# Patient Record
Sex: Female | Born: 1990 | Race: White | Hispanic: Yes | Marital: Single | Smoking: Never smoker
Health system: Southern US, Community
[De-identification: ages and names within clinical notes are randomized; demographics above are authoritative.]

---
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
## 2012-08-05 IMAGING — CR DG CHEST 2V
2 series · 2 of 2 positions shown · non-contrast
Comparison: 02/16/2011

CLINICAL DATA: Left chest pain

CHEST - 2 VIEW

[w chest pa]
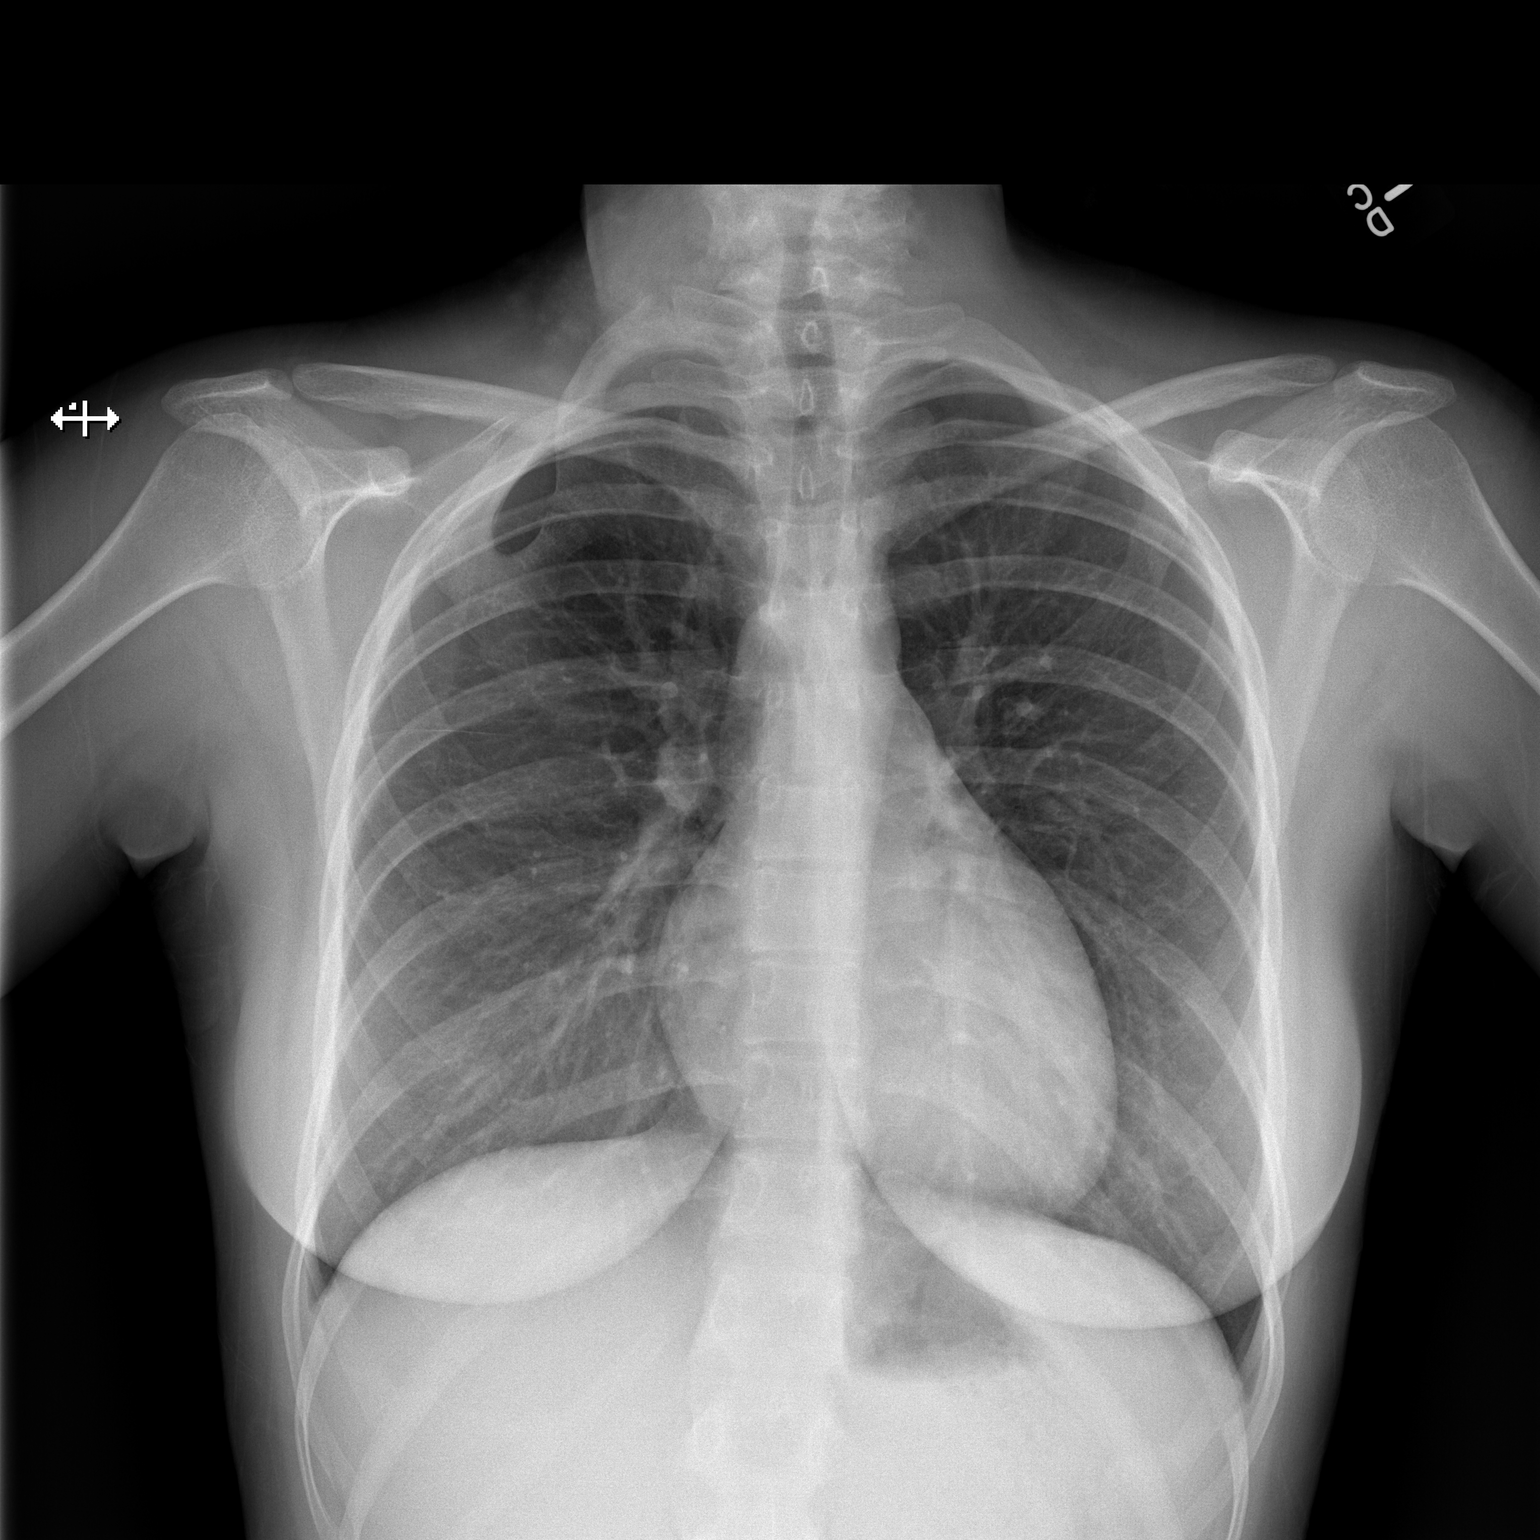

[w chest lat]
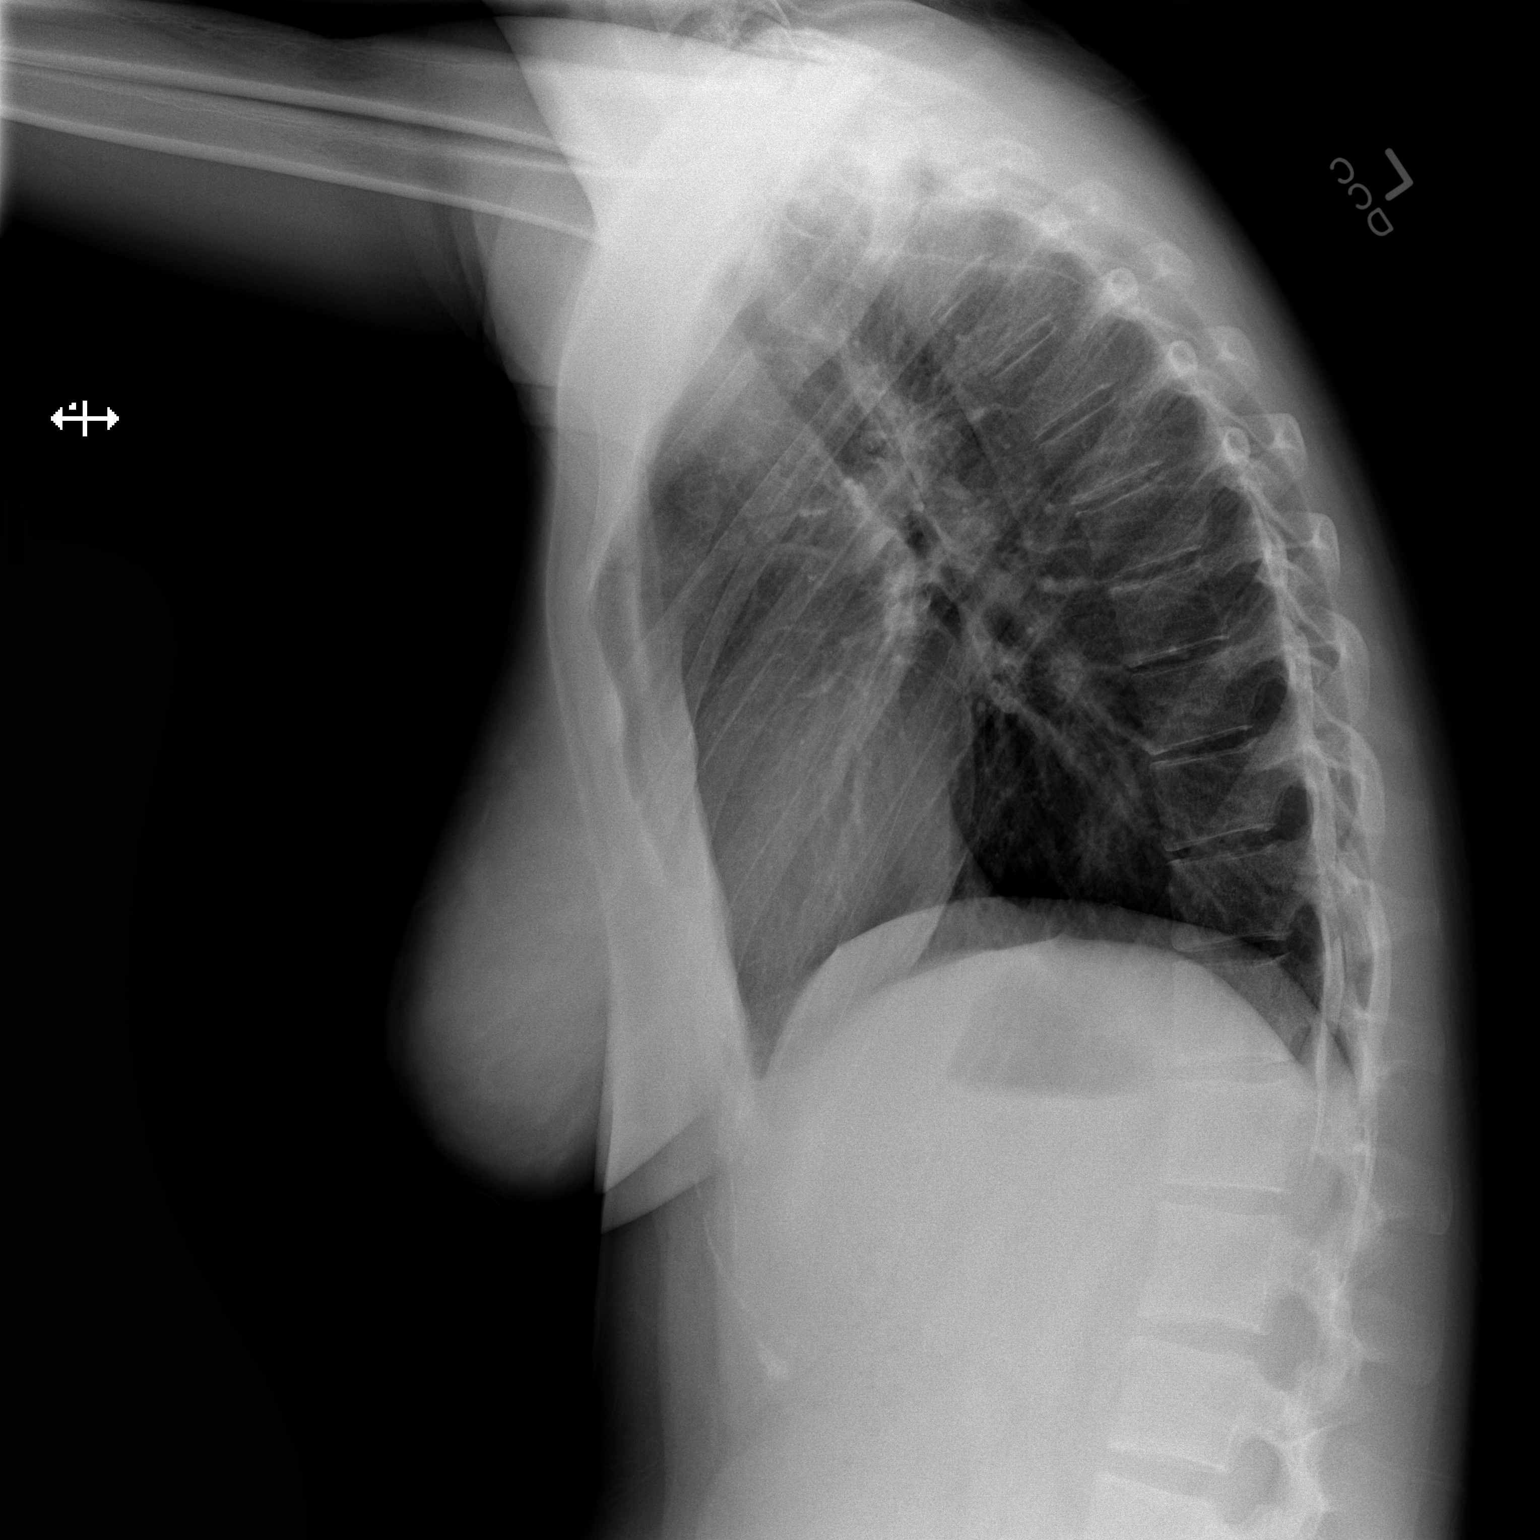

[2 of 2 positions shown; findings below may reference images not displayed]

FINDINGS: Lungs are clear. No pleural effusion or pneumothorax.

Cardiomediastinal silhouette is within normal limits.

Abnormal fusion of the right anterior first and second ribs.
Visualized osseous structures are otherwise within normal limits.
IMPRESSION: No evidence of acute cardiopulmonary disease.

## 2013-12-19 ENCOUNTER — Ambulatory Visit (INDEPENDENT_AMBULATORY_CARE_PROVIDER_SITE_OTHER): Payer: Managed Care, Other (non HMO) | Admitting: Physician Assistant

## 2013-12-19 VITALS — BP 110/68 | HR 112 | Temp 99.6°F | Resp 16 | Ht 63.75 in | Wt 150.0 lb

## 2013-12-19 DIAGNOSIS — H9209 Otalgia, unspecified ear: Secondary | ICD-10-CM

## 2013-12-19 DIAGNOSIS — H9201 Otalgia, right ear: Secondary | ICD-10-CM

## 2013-12-19 DIAGNOSIS — J039 Acute tonsillitis, unspecified: Secondary | ICD-10-CM

## 2013-12-19 DIAGNOSIS — J029 Acute pharyngitis, unspecified: Secondary | ICD-10-CM

## 2013-12-19 LAB — POCT CBC
Granulocyte percent: 83.4 %G — AB (ref 37–80)
HCT, POC: 40.3 % (ref 37.7–47.9)
Hemoglobin: 12.6 g/dL (ref 12.2–16.2)
Lymph, poc: 1.2 (ref 0.6–3.4)
MCH, POC: 26.7 pg — AB (ref 27–31.2)
MCHC: 31.4 g/dL — AB (ref 31.8–35.4)
MCV: 85.1 fL (ref 80–97)
MID (cbc): 0.4 (ref 0–0.9)
MPV: 7.4 fL (ref 0–99.8)
POC Granulocyte: 8 — AB (ref 2–6.9)
POC LYMPH PERCENT: 12.8 %L (ref 10–50)
POC MID %: 3.8 %M (ref 0–12)
Platelet Count, POC: 202 10*3/uL (ref 142–424)
RBC: 4.73 M/uL (ref 4.04–5.48)
RDW, POC: 13.9 %
WBC: 9.6 10*3/uL (ref 4.6–10.2)

## 2013-12-19 LAB — POCT RAPID STREP A (OFFICE): Rapid Strep A Screen: NEGATIVE

## 2013-12-19 MED ORDER — FIRST-DUKES MOUTHWASH MT SUSP: 5.0000 mL | OROMUCOSAL | Status: DC | PRN

## 2013-12-19 MED ORDER — AMOXICILLIN 875 MG PO TABS: 875.0000 mg | ORAL_TABLET | Freq: Two times a day (BID) | ORAL | Status: DC

## 2013-12-19 NOTE — Progress Notes (Signed)
Subjective:    Patient ID: Donna Key, female    DOB: 04-17-1991, 23 y.o.   MRN: [id number]  HPI 23 year old female presents for evaluation of 2 day history of body aches, chills, sore throat, and right sided ear pain. States symptoms continue to worsen and now she is having difficulty swallowing.  Admits pain on R>L.  Denies nasal congestion, cough, nausea, vomiting, or dizziness.  Took tylenol at 12:30 today which has not helped much. No trouble breathing.  Hx of strep 2 years ago. 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 Patient is otherwise healthy with no other concerns today.     Review of Systems  Constitutional: Positive for fever and chills.  HENT: Positive for ear pain (right), sore throat and trouble swallowing. Negative for congestion and rhinorrhea.   Respiratory: Negative for cough and shortness of breath.   Gastrointestinal: Negative for nausea and vomiting.  Neurological: Negative for dizziness and headaches.       Objective:   Physical Exam  Constitutional: She is oriented to person, place, and time. She appears well-developed and well-nourished.  HENT:  Head: Normocephalic and atraumatic.  Right Ear: Hearing, tympanic membrane, external ear and ear canal normal.  Left Ear: Hearing, tympanic membrane, external ear and ear canal normal.  Mouth/Throat: Uvula is midline. Oropharyngeal exudate and posterior oropharyngeal erythema (3+ tonsillar swelling) present. No posterior oropharyngeal edema or tonsillar abscesses.  Eyes: Conjunctivae are normal.  Neck: Normal range of motion. Neck supple.  Cardiovascular: Normal rate, regular rhythm and normal heart sounds.   Pulmonary/Chest: Effort normal and breath sounds normal.  Lymphadenopathy:    She has cervical adenopathy.  Neurological: She is alert and oriented to person, place, and time.  Psychiatric: She has a normal mood and affect. Her behavior is normal. Judgment and  thought content normal.   Results for orders placed in visit on 12/19/13  POCT RAPID STREP A (OFFICE)      Result Value Ref Range   Rapid Strep A Screen Negative  Negative  POCT CBC      Result Value Ref Range   WBC 9.6  4.6 - 10.2 K/uL   Lymph, poc 1.2  0.6 - 3.4   POC LYMPH PERCENT 12.8  10 - 50 %L   MID (cbc) 0.4  0 - 0.9   POC MID % 3.8  0 - 12 %M   POC Granulocyte 8.0 (*) 2 - 6.9   Granulocyte percent 83.4 (*) 37 - 80 %G   RBC 4.73  4.04 - 5.48 M/uL   Hemoglobin 12.6  12.2 - 16.2 g/dL   HCT, POC 40.9  81.1 - 47.9 %   MCV 85.1  80 - 97 fL   MCH, POC 26.7 (*) 27 - 31.2 pg   MCHC 31.4 (*) 31.8 - 35.4 g/dL   RDW, POC 91.4     Platelet Count, POC 202  142 - 424 K/uL   MPV 7.4  0 - 99.8 fL    Ibuprofen 600 mg given today while in the office. Patient able to swallow pills and water while here and tolerated ok.       Assessment & Plan:  Acute pharyngitis, unspecified pharyngitis type - Plan: POCT rapid strep A, POCT CBC, Culture, Group A Strep, amoxicillin (AMOXIL) 875 MG tablet, Diphenhyd-Hydrocort-Nystatin (FIRST-DUKES MOUTHWASH) SUSP  Will go ahead and treat with amoxicillin 875 mg bid x 10 days to cover bacterial tonsillitis. Throat culture pending [facility]  mouthwash q2-3hours prn pain. Continue ibuprofen 600 mg tid with food. Push fluids Out of work tomorrow. Recheck in 48-72 hours if no improvement, sooner if worse.

## 2013-12-22 LAB — CULTURE, GROUP A STREP: Organism ID, Bacteria: NORMAL

## 2014-10-06 ENCOUNTER — Ambulatory Visit (INDEPENDENT_AMBULATORY_CARE_PROVIDER_SITE_OTHER): Payer: Managed Care, Other (non HMO) | Admitting: Emergency Medicine

## 2014-10-06 VITALS — BP 108/68 | HR 69 | Temp 97.7°F | Resp 17 | Ht 65.0 in | Wt 157.0 lb

## 2014-10-06 DIAGNOSIS — S91209A Unspecified open wound of unspecified toe(s) with damage to nail, initial encounter: Secondary | ICD-10-CM | POA: Diagnosis not present

## 2014-10-06 NOTE — Progress Notes (Signed)
Procedure:  Consent obtained.  Digital block with 1% lido. Nail removed without difficulty.  Drsg placed and wound cared d/w pt.

## 2014-10-06 NOTE — Progress Notes (Signed)
   Subjective:  This chart was scribed for Donna LitesSteve Takeisha Cianci, MD by Andrew Auaven Small, ED Scribe. This patient was seen in room 7 and the patient's care was started at 10:59 AM.  Patient ID: Donna MethLeslie Key, female    DOB: 1990/06/08, 24 y.o.   MRN: [id number]  HPI Chief Complaint  Patient presents with  . Nail Problem    hit on dresser, toe nail broken, put anaspeptic on big right toe, happened yesterday   HPI Comments: Donna Key is a 24 y.o. female who presents to the [facility] complaining of right great toe nail injury that occurred yesterday. Pt states while moving furniture yesterday, wearing sandals, she hit right great toe nail on a dresser while lifting it. Pt presents with broken right great toe nail but still attached. She applied gentian violet to toe nail. Pt last TDAP was 10/2013. She denies drug allergies.   History reviewed. No pertinent past medical history. No Known Allergies Prior to Admission medications   Medication Sig Start Date End Date Taking? Authorizing Provider  amoxicillin (AMOXIL) 875 MG tablet Take 1 tablet (875 mg total) by mouth 2 (two) times daily. Patient not taking: Reported on 10/06/2014 12/19/13   Nelva NayHeather M Marte, PA-C  Diphenhyd-Hydrocort-Nystatin (FIRST-DUKES MOUTHWASH) SUSP Use as directed 5-10 mLs in the mouth or throat every 2 (two) hours as needed. Use 1:1 ratio with viscous lidocaine Patient not taking: Reported on 10/06/2014 12/19/13   Nelva NayHeather M Marte, PA-C   Review of Systems  Skin: Positive for color change ( gentian violet) and wound.   Objective:   Physical Exam CONSTITUTIONAL: Well developed/well nourished HEAD: Normocephalic/atraumatic EYES: EOMI/PERRL ENMT: Mucous membranes moist NECK: supple no meningeal signs SPINE/BACK:entire spine nontender CV: S1/S2 noted, no murmurs/rubs/gallops noted LUNGS: Lungs are clear to auscultation bilaterally, no apparent distress ABDOMEN: soft, nontender, no rebound or guarding, bowel  sounds noted throughout abdomen GU:no cva tenderness NEURO: Pt is awake/alert/appropriate, moves all extremitiesx4.  No facial droop.   EXTREMITIES: pulses normal/equal, full ROM SKIN: warm, color normal. Nail on right great toe is avulsed off of the nail matrix but still attached at based. Toe is discolored purple secondary to Gentian violet. PSYCH: no abnormalities of mood noted, alert and oriented to situation  Filed Vitals:   10/06/14 0952  BP: 108/68  Pulse: 69  Temp: 97.7 F (36.5 C)  TempSrc: Oral  Resp: 17  Height: 5\' 5"  (1.651 m)  Weight: 157 lb (71.215 kg)  SpO2: 93%    Assessment & Plan:  No evidence of infection at the present time. We'll remove the nail and do standard nail plate removal treatment.I personally performed the services described in this documentation, which was scribed in my presence. The recorded information has been reviewed and is accurate.  Donna LitesSteve Rhythm Gubbels, MD

## 2014-10-06 NOTE — Patient Instructions (Addendum)
INGROWN TOENAIL . Keep area clean, dry and bandaged for 24 hours. . After 24 hours, remove outer bandage and leave yellow gauze in place. Donna Key. Soak toe/foot in warm soapy water for 5-10 minutes, once daily for 5 days. Rebandage toe after each cleaning. . Continue soaks until yellow gauze falls off. . Notify the office if you experience any of the following signs of infection: Swelling, redness, pus drainage, streaking, fever > 101.0 F  Nail Avulsion Injury Nail avulsion means that you have lost the whole, or part of a nail. The nail will usually grow back in 2 to 6 months. If your injury damaged the growth center of the nail, the nail may be deformed, split, or not stuck to the nail bed. Sometimes the avulsed nail is stitched back in place. This provides temporary protection to the nail bed until the new nail grows in.  HOME CARE INSTRUCTIONS   Raise (elevate) your injury as much as possible.  Protect the injury and cover it with bandages (dressings) or splints as instructed.  Change dressings as instructed. SEEK MEDICAL CARE IF:   There is increasing pain, redness, or swelling.  You cannot move your fingers or toes. Document Released: 05/26/2004 Document Revised: 07/11/2011 Document Reviewed: 03/20/2009 [facility] Patient Information 2015 [employer] [place]. This information is not intended to replace advice given to you by your health care provider. Make sure you discuss any questions you have with your health care provider.

## 2015-01-25 ENCOUNTER — Emergency Department (INDEPENDENT_AMBULATORY_CARE_PROVIDER_SITE_OTHER)
Admission: EM | Admit: 2015-01-25 | Discharge: 2015-01-25 | Disposition: A | Discharge status: Home / Self Care | Payer: Managed Care, Other (non HMO) | Source: Home / Self Care

## 2015-01-25 DIAGNOSIS — M791 Myalgia, unspecified site: Secondary | ICD-10-CM

## 2015-01-25 DIAGNOSIS — J029 Acute pharyngitis, unspecified: Secondary | ICD-10-CM

## 2015-01-25 LAB — POCT URINALYSIS DIP (DEVICE)
Glucose, UA: NEGATIVE mg/dL
Ketones, ur: NEGATIVE mg/dL
NITRITE: NEGATIVE
PH: 6.5 (ref 5.0–8.0)
Protein, ur: 30 mg/dL — AB
SPECIFIC GRAVITY, URINE: 1.025 (ref 1.005–1.030)
UROBILINOGEN UA: 2 mg/dL — AB (ref 0.0–1.0)

## 2015-01-25 LAB — POCT RAPID STREP A: Streptococcus, Group A Screen (Direct): NEGATIVE

## 2015-01-25 MED ORDER — OSELTAMIVIR PHOSPHATE 75 MG PO CAPS: 75.0000 mg | ORAL_CAPSULE | Freq: Two times a day (BID) | ORAL | Status: AC

## 2015-01-25 MED ORDER — IBUPROFEN 400 MG PO TABS: 400.0000 mg | ORAL_TABLET | Freq: Four times a day (QID) | ORAL | Status: AC | PRN

## 2015-01-25 NOTE — Discharge Instructions (Signed)
It was nice seeing you today. I am sorry about your pain. Your strep test is negative. Since you don't have fever, It is uncertain you have influenza. I have given you Tamiflu, in case you start running fever with your symptoms please start medication. Return to the ED if symptoms persist. In the mean time, hydrate your self and rest at home.  Muscle Pain Muscle pain (myalgia) may be caused by many things, including:  Overuse or muscle strain, especially if you are not in shape. This is the most common cause of muscle pain.  Injury.  Bruises.  Viruses, such as the flu.  Infectious diseases.  Fibromyalgia, which is a chronic condition that causes muscle tenderness, fatigue, and headache.  Autoimmune diseases, including lupus.  Certain drugs, including ACE inhibitors and statins. Muscle pain may be mild or severe. In most cases, the pain lasts only a short time and goes away without treatment. To diagnose the cause of your muscle pain, your health care provider will take your medical history. This means he or she will ask you when your muscle pain began and what has been happening. If you have not had muscle pain for very long, your health care provider may want to wait before doing much testing. If your muscle pain has lasted a long time, your health care provider may want to run tests right away. If your health care provider thinks your muscle pain may be caused by illness, you may need to have additional tests to rule out certain conditions.  Treatment for muscle pain depends on the cause. Home care is often enough to relieve muscle pain. Your health care provider may also prescribe anti-inflammatory medicine. HOME CARE INSTRUCTIONS Watch your condition for any changes. The following actions may help to lessen any discomfort you are feeling:  Only take over-the-counter or prescription medicines as directed by your health care provider.  Apply ice to the sore muscle:  Put ice in a  plastic bag.  Place a towel between your skin and the bag.  Leave the ice on for 15-20 minutes, 3-4 times a day.  You may alternate applying hot and cold packs to the muscle as directed by your health care provider.  If overuse is causing your muscle pain, slow down your activities until the pain goes away.  Remember that it is normal to feel some muscle pain after starting a workout program. Muscles that have not been used often will be sore at first.  Do regular, gentle exercises if you are not usually active.  Warm up before exercising to lower your risk of muscle pain.  Do not continue working out if the pain is very bad. Bad pain could mean you have injured a muscle. SEEK MEDICAL CARE IF:  Your muscle pain gets worse, and medicines do not help.  You have muscle pain that lasts longer than 3 days.  You have a rash or fever along with muscle pain.  You have muscle pain after a tick bite.  You have muscle pain while working out, even though you are in good physical condition.  You have redness, soreness, or swelling along with muscle pain.  You have muscle pain after starting a new medicine or changing the dose of a medicine. SEEK IMMEDIATE MEDICAL CARE IF:  You have trouble breathing.  You have trouble swallowing.  You have muscle pain along with a stiff neck, fever, and vomiting.  You have severe muscle weakness or cannot move part of your body.  MAKE SURE YOU:   Understand these instructions.  Will watch your condition.  Will get help right away if you are not doing well or get worse. Document Released: 03/10/2006 Document Revised: 04/23/2013 Document Reviewed: 02/12/2013 [facility] Patient Information 2015 [place], [place]. This information is not intended to replace advice given to you by your health care provider. Make sure you discuss any questions you have with your health care provider.

## 2015-01-25 NOTE — ED Provider Notes (Addendum)
CSN: [id number]     Arrival date & time 01/25/15  1937 History   None    Chief Complaint  Patient presents with  . Sore Throat  . Generalized Body Aches   (Consider location/radiation/quality/duration/timing/severity/associated sxs/prior Treatment) Patient is a 24 y.o. female presenting with pharyngitis. The history is provided by the patient. No language interpreter was used.  Sore Throat This is a new problem. The current episode started 6 to 12 hours ago. The problem occurs constantly. The problem has been gradually worsening. Pertinent negatives include no chest pain, no abdominal pain, no headaches and no shortness of breath. Nothing aggravates the symptoms. Nothing relieves the symptoms. Treatments tried: She used amoxicillin this morning, but it didn't help. She had this at home previously from her dentist. She also took tylenol.  myalgia: Also c/o right leg pain and pain over the right side of her abdomen as well as her back or the right side this is intermittent. Pain is about 8/10 in severity, this started last night as well. No sick contact. She is yet to get her flu shot this year. She denies chest pain, no SOB. She has Mirena IUD for birth control.  No past medical history on file. No past surgical history on file. Family History  Problem Relation Age of Onset  . Diabetes Paternal Grandmother   . Diabetes Father    Social History  Substance Use Topics  . Smoking status: Never Smoker   . Smokeless tobacco: Never Used  . Alcohol Use: No   OB History    Gravida Para Term Preterm AB TAB SAB Ectopic Multiple Living   Review of Systems  Constitutional: Negative for fever and fatigue.  HENT: Positive for sore throat. Negative for ear discharge.   Respiratory: Negative for shortness of breath.   Cardiovascular: Negative for chest pain.  Gastrointestinal: Negative for abdominal pain.  Musculoskeletal: Positive for myalgias, back pain and arthralgias.    Neurological: Negative for light-headedness and headaches.  All other systems reviewed and are negative.   Allergies  Review of patient's allergies indicates no known allergies.  Home Medications   Prior to Admission medications   Medication Sig Start Date End Date Taking? Authorizing Provider  amoxicillin (AMOXIL) 875 MG tablet Take 1 tablet (875 mg total) by mouth 2 (two) times daily. Patient not taking: Reported on 10/06/2014 12/19/13   Nelva Nay, PA-C  Diphenhyd-Hydrocort-Nystatin (FIRST-DUKES MOUTHWASH) SUSP Use as directed 5-10 mLs in the mouth or throat every 2 (two) hours as needed. Use 1:1 ratio with viscous lidocaine Patient not taking: Reported on 10/06/2014 12/19/13   Nelva Nay, PA-C   Meds Ordered and Administered this Visit  Medications - No data to display  BP 113/79 mmHg  Pulse 118  Temp(Src) 98.9 F (37.2 C) (Oral)  Resp 16  SpO2 98%  LMP 01/10/2015 (Approximate) No data found.   Physical Exam  Constitutional: She appears well-developed. No distress.  HENT:  Right Ear: Tympanic membrane and ear canal normal.  Left Ear: Tympanic membrane and ear canal normal.  Mouth/Throat: Uvula is midline, oropharynx is clear and moist and mucous membranes are normal.  Cardiovascular: Regular rhythm and normal heart sounds.  Tachycardia present.   No murmur heard. Pulmonary/Chest: Effort normal and breath sounds normal. No respiratory distress. She has no decreased breath sounds. She has no wheezes. She has no rhonchi. She has no rales.  Abdominal: Soft. Bowel  sounds are normal. She exhibits no distension and no mass. There is no splenomegaly or hepatomegaly. There is no rebound, no guarding and no CVA tenderness.    Musculoskeletal:       Lumbar back: Normal.       Legs: Nursing note and vitals reviewed.   ED Course  Procedures (including critical care time)  Labs Review Labs Reviewed  POCT RAPID STREP A    Imaging Review No results found.   Visual  Acuity Review  Right Eye Distance:   Left Eye Distance:   Bilateral Distance:    Right Eye Near:   Left Eye Near:    Bilateral Near:      HR 112   MDM  No diagnosis found. Pharyngitis Myalgia : Pain over muscle of the back, abdomen and legs. Tachycardia  Rapid strep test was negative. UA with small leukocyte. I sent her urine to lab for culture. No antibiotic given since she does not have urinary symptoms. She is afebrile hence influenza unlikely but possible especially with her constellation of symptoms in addition to tachycardia.  I gave prescription for Tamiflu and recommended she takes it if she becomes febrile or not getting better. Increase hydration and rest at home recommended. HR elevated, it improved some after recheck. This could be related to her pain. She does not have any chest pain or SOB. Treat pain with Ibuprofen. Hydrate self and rest at home. Return precaution and red flag that needs ED visit discussed with her. She verbalized understanding.      Doreene Eland, MD 01/25/15 2051  Doreene Eland, MD 01/25/15 2151

## 2015-01-25 NOTE — ED Notes (Signed)
The patient presented to the [facility] with a complaint of a sore throat and general body aches times since last night. The patient stated that she tried tylenol with no relief.

## 2015-01-27 ENCOUNTER — Emergency Department (HOSPITAL_COMMUNITY)
Admission: EM | Admit: 2015-01-27 | Discharge: 2015-01-27 | Disposition: A | Discharge status: Home / Self Care | Payer: Managed Care, Other (non HMO) | Attending: Emergency Medicine | Admitting: Emergency Medicine

## 2015-01-27 ENCOUNTER — Emergency Department (HOSPITAL_COMMUNITY): Payer: Managed Care, Other (non HMO)

## 2015-01-27 ENCOUNTER — Telehealth: Payer: Self-pay | Admitting: Family Medicine

## 2015-01-27 ENCOUNTER — Encounter (HOSPITAL_COMMUNITY): Payer: Self-pay

## 2015-01-27 DIAGNOSIS — R945 Abnormal results of liver function studies: Secondary | ICD-10-CM | POA: Insufficient documentation

## 2015-01-27 DIAGNOSIS — Z3202 Encounter for pregnancy test, result negative: Secondary | ICD-10-CM | POA: Insufficient documentation

## 2015-01-27 DIAGNOSIS — R1010 Upper abdominal pain, unspecified: Secondary | ICD-10-CM | POA: Diagnosis not present

## 2015-01-27 DIAGNOSIS — R1110 Vomiting, unspecified: Secondary | ICD-10-CM | POA: Diagnosis not present

## 2015-01-27 DIAGNOSIS — R1011 Right upper quadrant pain: Secondary | ICD-10-CM | POA: Diagnosis not present

## 2015-01-27 DIAGNOSIS — R05 Cough: Secondary | ICD-10-CM | POA: Insufficient documentation

## 2015-01-27 DIAGNOSIS — R0781 Pleurodynia: Secondary | ICD-10-CM | POA: Diagnosis present

## 2015-01-27 DIAGNOSIS — R7989 Other specified abnormal findings of blood chemistry: Secondary | ICD-10-CM

## 2015-01-27 LAB — COMPREHENSIVE METABOLIC PANEL
ALBUMIN: 3.7 g/dL (ref 3.5–5.0)
ALT: 106 U/L — ABNORMAL HIGH (ref 14–54)
AST: 80 U/L — AB (ref 15–41)
Alkaline Phosphatase: 151 U/L — ABNORMAL HIGH (ref 38–126)
Anion gap: 11 (ref 5–15)
BILIRUBIN TOTAL: 1.5 mg/dL — AB (ref 0.3–1.2)
BUN: 7 mg/dL (ref 6–20)
CHLORIDE: 106 mmol/L (ref 101–111)
CO2: 22 mmol/L (ref 22–32)
Calcium: 9 mg/dL (ref 8.9–10.3)
Creatinine, Ser: 0.57 mg/dL (ref 0.44–1.00)
GFR calc Af Amer: >60 mL/min (ref >60)
GFR calc non Af Amer: >60 mL/min (ref >60)
GLUCOSE: 112 mg/dL — AB (ref 65–99)
POTASSIUM: 3.6 mmol/L (ref 3.5–5.1)
Sodium: 139 mmol/L (ref 135–145)
TOTAL PROTEIN: 7.5 g/dL (ref 6.5–8.1)

## 2015-01-27 LAB — URINALYSIS, ROUTINE W REFLEX MICROSCOPIC
GLUCOSE, UA: NEGATIVE mg/dL
HGB URINE DIPSTICK: NEGATIVE
Ketones, ur: >80 mg/dL — AB
Nitrite: NEGATIVE
PH: 5.5 (ref 5.0–8.0)
Protein, ur: NEGATIVE mg/dL
SPECIFIC GRAVITY, URINE: 1.036 — AB (ref 1.005–1.030)
Urobilinogen, UA: 1 mg/dL (ref 0.0–1.0)

## 2015-01-27 LAB — CULTURE, GROUP A STREP

## 2015-01-27 LAB — CBC
HEMATOCRIT: 37.8 % (ref 36.0–46.0)
Hemoglobin: 12.9 g/dL (ref 12.0–15.0)
MCH: 28.2 pg (ref 26.0–34.0)
MCHC: 34.1 g/dL (ref 30.0–36.0)
MCV: 82.5 fL (ref 78.0–100.0)
Platelets: 226 10*3/uL (ref 150–400)
RBC: 4.58 MIL/uL (ref 3.87–5.11)
RDW: 13.4 % (ref 11.5–15.5)
WBC: 8.5 10*3/uL (ref 4.0–10.5)

## 2015-01-27 LAB — URINE MICROSCOPIC-ADD ON

## 2015-01-27 LAB — POC URINE PREG, ED: Preg Test, Ur: NEGATIVE

## 2015-01-27 LAB — URINE CULTURE: Culture: >=100000

## 2015-01-27 LAB — LIPASE, BLOOD: Lipase: 23 U/L (ref 22–51)

## 2015-01-27 MED ORDER — ONDANSETRON HCL 4 MG/2ML IJ SOLN
4.0000 mg | Freq: Once | INTRAMUSCULAR | Status: AC
  Administered 2015-01-27: 4 mg via INTRAVENOUS
  Filled 2015-01-27: qty 2

## 2015-01-27 MED ORDER — SODIUM CHLORIDE 0.9 % IV SOLN
1000.0000 mL | Freq: Once | INTRAVENOUS | Status: AC
  Administered 2015-01-27: 1000 mL via INTRAVENOUS

## 2015-01-27 MED ORDER — SODIUM CHLORIDE 0.9 % IV SOLN
1000.0000 mL | INTRAVENOUS | Status: DC
  Administered 2015-01-27: 1000 mL via INTRAVENOUS

## 2015-01-27 MED ORDER — HYDROMORPHONE HCL 1 MG/ML IJ SOLN
1.0000 mg | Freq: Once | INTRAMUSCULAR | Status: AC
  Administered 2015-01-27: 1 mg via INTRAVENOUS
  Filled 2015-01-27: qty 1

## 2015-01-27 MED ORDER — HYDROCODONE-ACETAMINOPHEN 5-325 MG PO TABS: 1.0000 | ORAL_TABLET | ORAL | Status: AC | PRN

## 2015-01-27 MED ORDER — ONDANSETRON 8 MG PO TBDP: 8.0000 mg | ORAL_TABLET | Freq: Three times a day (TID) | ORAL | Status: AC | PRN

## 2015-01-27 NOTE — ED Provider Notes (Addendum)
CSN: [id number]     Arrival date & time 01/27/15  0035 History  By signing my name below, I, Phillis Haggis, attest that this documentation has been prepared under the direction and in the presence of Azalia Bilis, MD. Electronically Signed: Phillis Haggis, ED Scribe. 01/27/2015. 2:48 AM.    Chief Complaint  Patient presents with  . Rib Injury   The history is provided by the patient. No language interpreter was used.   HPI Comments: Donna Key is a 24 y.o. female who presents to the Emergency Department complaining of sharp, radiating to back, right sided rib pain onset 4 days ago. Pt reports associated SOB; states that it is painful for her to breathe. Pt states that she has began to have emesis x3 today. Pt reports decreased urine output. States that she has a productive cough with yellow/green mucus that was determined to be viral at Urgent Care. Denies injury or fall, abdominal pain, dysuria, or hematuria.  History reviewed. No pertinent past medical history. History reviewed. No pertinent past surgical history. Family History  Problem Relation Age of Onset  . Diabetes Paternal Grandmother   . Diabetes Father    Social History  Substance Use Topics  . Smoking status: Never Smoker   . Smokeless tobacco: Never Used  . Alcohol Use: No   OB History    Gravida Para Term Preterm AB TAB SAB Ectopic Multiple Living   Review of Systems  Respiratory: Positive for cough.   Cardiovascular:       Right rib pain  Gastrointestinal: Positive for vomiting. Negative for abdominal pain.  All other systems reviewed and are negative.     Allergies  Review of patient's allergies indicates no known allergies.  Home Medications   Prior to Admission medications   Medication Sig Start Date End Date Taking? Authorizing Provider  ibuprofen (ADVIL,MOTRIN) 400 MG tablet Take 1 tablet (400 mg total) by mouth every 6 (six) hours as needed. 01/25/15  Yes Doreene Eland, MD   amoxicillin (AMOXIL) 875 MG tablet Take 1 tablet (875 mg total) by mouth 2 (two) times daily. Patient not taking: Reported on 10/06/2014 12/19/13   Nelva Nay, PA-C  Diphenhyd-Hydrocort-Nystatin (FIRST-DUKES MOUTHWASH) SUSP Use as directed 5-10 mLs in the mouth or throat every 2 (two) hours as needed. Use 1:1 ratio with viscous lidocaine Patient not taking: Reported on 10/06/2014 12/19/13   Nelva Nay, PA-C  oseltamivir (TAMIFLU) 75 MG capsule Take 1 capsule (75 mg total) by mouth 2 (two) times daily. 01/25/15   Doreene Eland, MD   BP 111/59 mmHg  Pulse 74  Temp(Src) 98.3 F (36.8 C) (Oral)  Resp 16  Ht  (1.626 m)  Wt 150 lb (68.04 kg)  BMI 25.73 kg/m2  SpO2 97%  LMP 01/10/2015 (Approximate)  Physical Exam  Constitutional: She is oriented to person, place, and time. She appears well-developed and well-nourished. No distress.  HENT:  Head: Normocephalic and atraumatic.  Eyes: EOM are normal.  Neck: Normal range of motion.  Cardiovascular: Normal rate, regular rhythm and normal heart sounds.   Pulmonary/Chest: Effort normal and breath sounds normal.  Abdominal: Soft. She exhibits no distension. There is tenderness in the right upper quadrant.  Musculoskeletal: Normal range of motion.  Neurological: She is alert and oriented to person, place, and time.  Skin: Skin is warm and dry.  No rash to back  Psychiatric: She has  a normal mood and affect. Judgment normal.  Nursing note and vitals reviewed.   ED Course  Procedures (including critical care time) DIAGNOSTIC STUDIES: Oxygen Saturation is 97% on RA, normal by my interpretation.    COORDINATION OF CARE: 2:50 AM-Discussed treatment plan which includes [place], labs, x-ray and pain medication with pt at bedside and pt agreed to plan.   Labs Review Labs Reviewed  COMPREHENSIVE METABOLIC PANEL - Abnormal; Notable for the following:    Glucose, Bld 112 (*)    AST 80 (*)    ALT 106 (*)    Alkaline Phosphatase 151 (*)     Total Bilirubin 1.5 (*)    All other components within normal limits  URINALYSIS, ROUTINE W REFLEX MICROSCOPIC (NOT AT [facility]) - Abnormal; Notable for the following:    Color, Urine AMBER (*)    APPearance CLOUDY (*)    Specific Gravity, Urine 1.036 (*)    Bilirubin Urine SMALL (*)    Ketones, ur >80 (*)    Leukocytes, UA TRACE (*)    All other components within normal limits  URINE MICROSCOPIC-ADD ON - Abnormal; Notable for the following:    Squamous Epithelial / LPF MANY (*)    Bacteria, UA FEW (*)    All other components within normal limits  LIPASE, BLOOD  CBC  POC URINE PREG, ED    Imaging Review Dg Chest 2 View  01/27/2015   CLINICAL DATA:  RIGHT rib pain, chest pain and vomiting today.  EXAM: CHEST  2 VIEW  COMPARISON:  Chest radiograph February 17, 2011  FINDINGS: Cardiomediastinal silhouette is normal. The lungs are clear without pleural effusions or focal consolidations. Trachea projects midline and there is no pneumothorax. Soft tissue planes and included osseous structures are non-suspicious.  IMPRESSION: Normal chest.   Electronically Signed   By: Awilda Metro M.D.   On: 01/27/2015 04:37   US Abdomen Complete  01/27/2015   CLINICAL DATA:  RIGHT-sided abdominal pain. Evaluate gallbladder and potential RIGHT-sided hydronephrosis.  EXAM: ULTRASOUND ABDOMEN COMPLETE  COMPARISON:  CT abdomen and pelvis February 16, 2011  FINDINGS: Gallbladder: No gallstones or wall thickening visualized. No sonographic Murphy sign noted.  Common bile duct: Diameter: 3 mm  Liver: Heterogeneous lung attenuation without intrahepatic biliary dilatation. Hepatopetal portal vein.  IVC: No abnormality visualized.  Pancreas: Visualized portion unremarkable.  Spleen: Size and appearance within normal limits.  Right Kidney: Length: 10.8 cm. Echogenicity within normal limits. No mass or hydronephrosis visualized.  Left Kidney: Length: 11.3 cm. Echogenicity within normal limits. No mass or hydronephrosis  visualized.  Abdominal aorta: No aneurysm visualized.  Other findings: Small RIGHT pleural effusion.  IMPRESSION: Heterogeneous lung attenuation may represent hepatic steatosis.  No acute cholecystitis.  No obstructive uropathy.  Small RIGHT pleural effusion, recommend dedicated chest radiograph.   Electronically Signed   By: Awilda Metro M.D.   On: 01/27/2015 04:11  I personally reviewed the imaging tests through PACS system I reviewed available ER/hospitalization records through the EMR     EKG Interpretation None      MDM   Final diagnoses:  Upper abdominal pain  Elevated liver function tests    5:47 AM Patient feels much better at this time.   Ultrasound of her abdomen demonstrates normal common bile duct and no obvious gallstones.  She has mild elevation of her liver enzymes and alkaline phosphatase.  This may represent recently passed common bile duct stone.  Patient understands return to ER for new or worsening symptoms.  Outpatient GI follow-up.  She will need to have her LFTs rechecked in 1 week.  I personally performed the services described in this documentation, which was scribed in my presence. The recorded information has been reviewed and is accurate.   Azalia Bilis, MD 01/27/15 1610  Azalia Bilis, MD 01/27/15 [phone]

## 2015-01-27 NOTE — ED Notes (Signed)
Pt taken to US

## 2015-01-27 NOTE — ED Notes (Addendum)
Pt reports right sided rib cage pain, onset Saturday. Emesis x 3. Productive cough of yellow/green mucous. She went to [facility] last night, tests came back negative and told it possibly was a virus and was prescribed advil. Here tonight because the pain is not going away.

## 2015-01-27 NOTE — Telephone Encounter (Signed)
I called to discuss her urine culture report which is positive for Diphtheroid spp. Patient denies any urinary symptoms. She is feeling much better with her body aches and belly pain. As discussed with her this is likely a normal flora but I recommended repeat urine. She stated she will schedule followup with her OB/GYN specialist in 1-2 wks to recheck urine. Return precaution discussed.

## 2015-01-27 NOTE — Discharge Instructions (Signed)

## 2015-01-27 NOTE — ED Notes (Signed)
Patient transported to X-ray 

## 2016-07-15 IMAGING — DX DG CHEST 2V
2 series · 2 of 2 positions shown · non-contrast
Comparison: Chest radiograph February 17, 2011

CLINICAL DATA: RIGHT rib pain, chest pain and vomiting today.

EXAM:
CHEST  2 VIEW

[w chest pa]
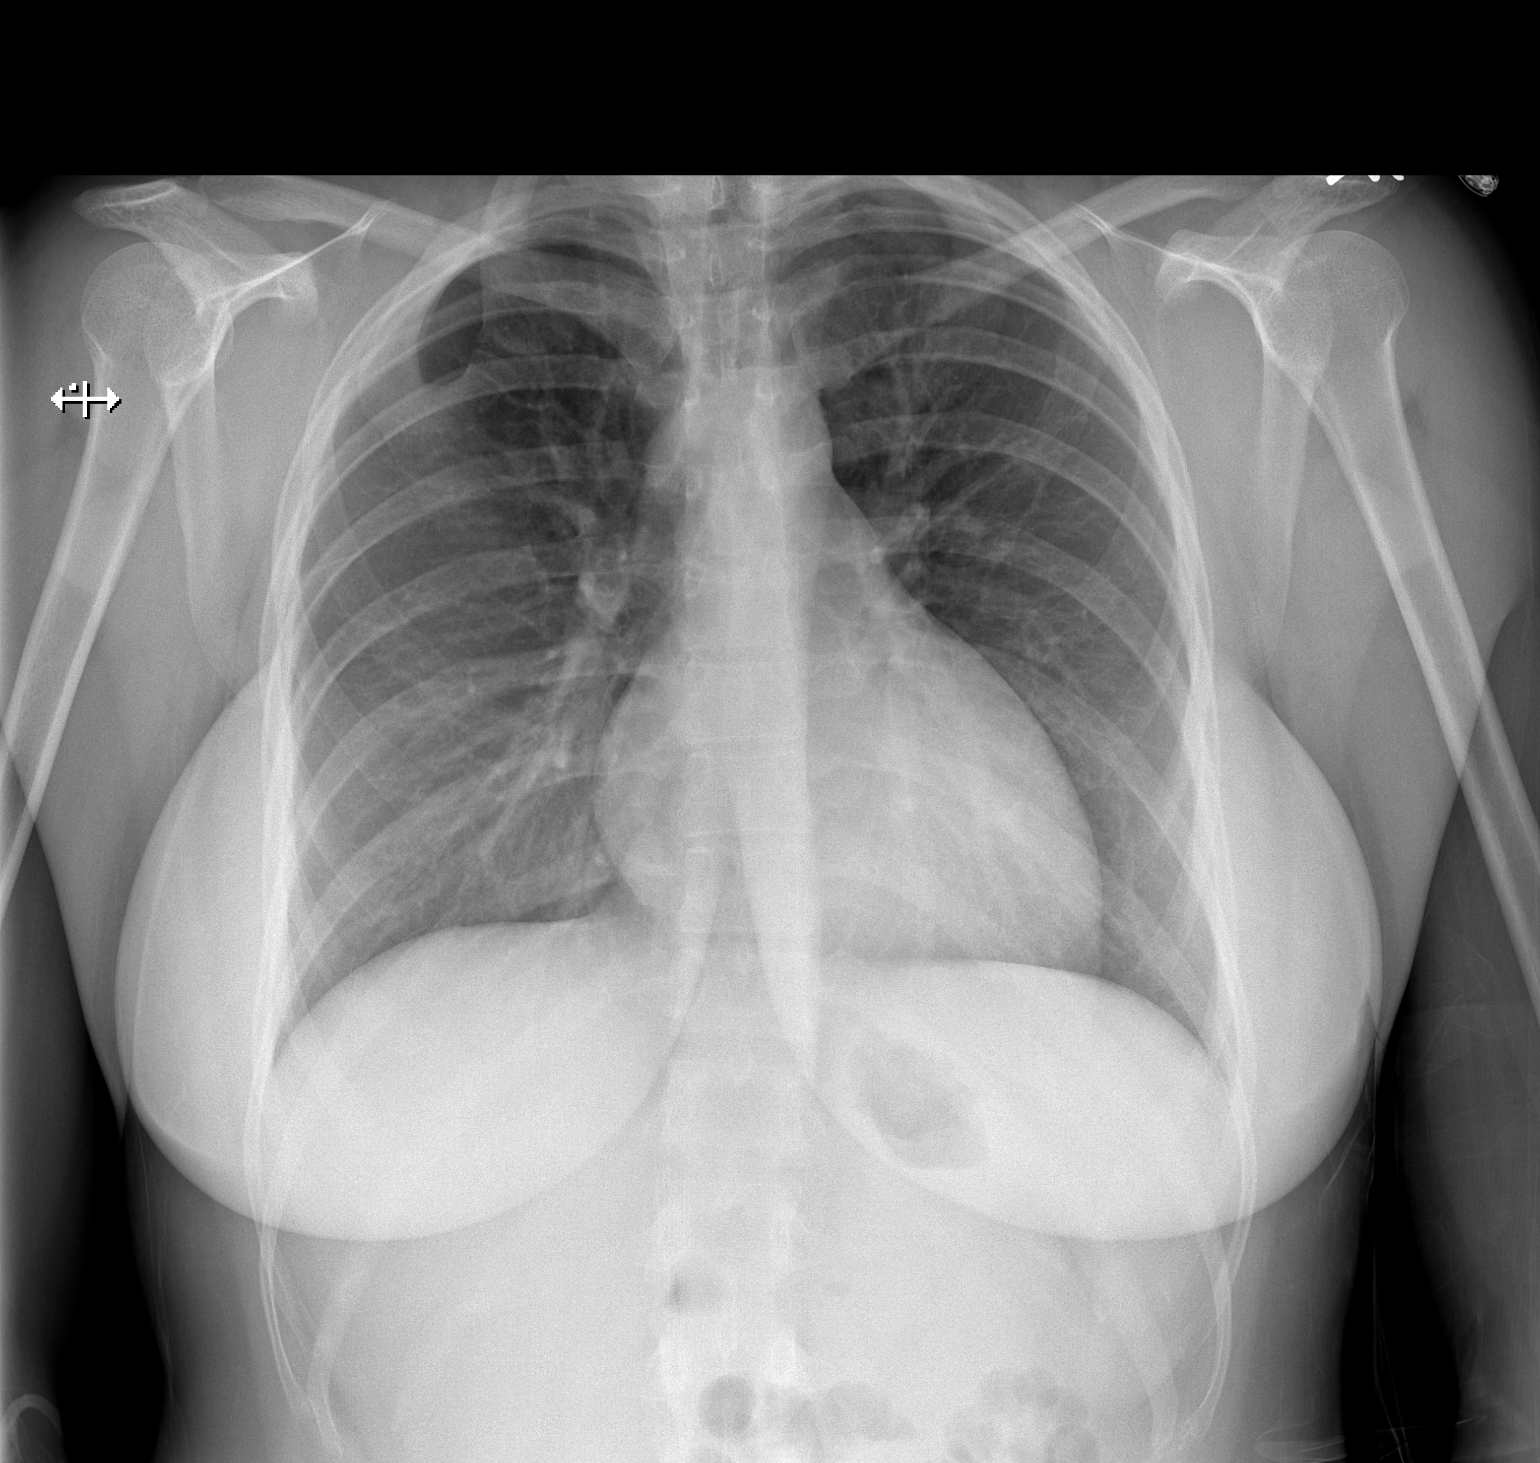

[w chest lat]
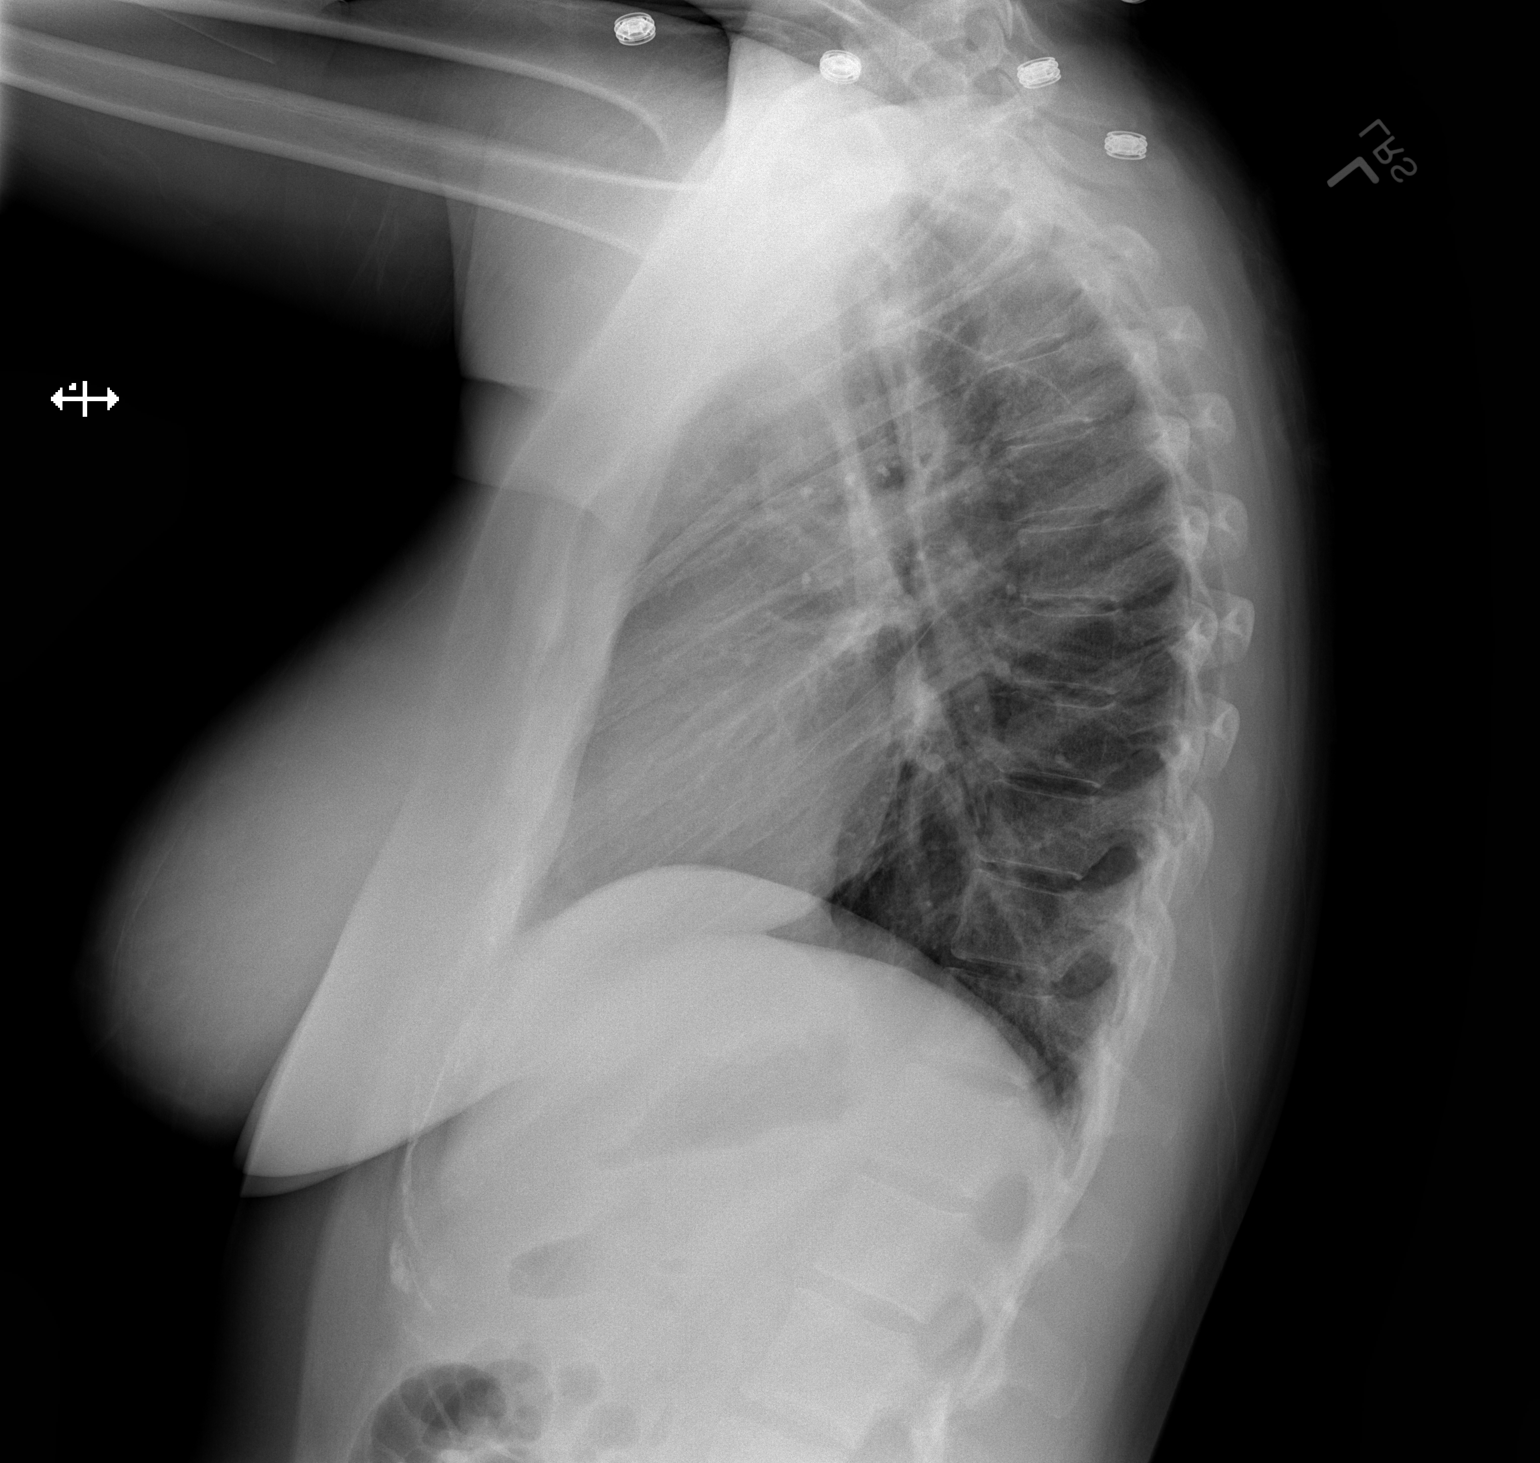

[2 of 2 positions shown; findings below may reference images not displayed]

FINDINGS: Cardiomediastinal silhouette is normal. The lungs are clear without
pleural effusions or focal consolidations. Trachea projects midline
and there is no pneumothorax. Soft tissue planes and included
osseous structures are non-suspicious.
IMPRESSION: Normal chest.

## 2016-10-01 IMAGING — US US ABDOMEN COMPLETE
1 series · 14 of 25 positions shown · non-contrast
Comparison: CT abdomen and pelvis February 16, 2011

CLINICAL DATA: RIGHT-sided abdominal pain. Evaluate gallbladder and
potential RIGHT-sided hydronephrosis.

EXAM:
ULTRASOUND ABDOMEN COMPLETE

[Series 1: us abdomen complete · 0.22mm/px · 14 of 67 slices shown]
[im 1/67]
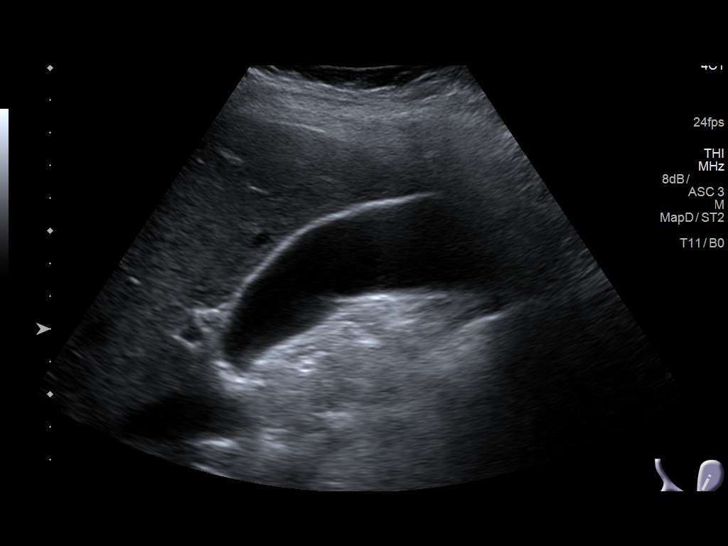
[im 6/67]
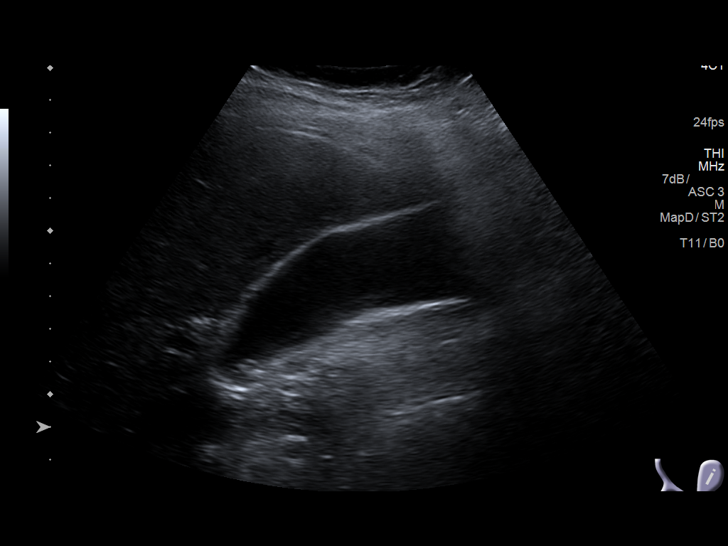
[im 12/67]
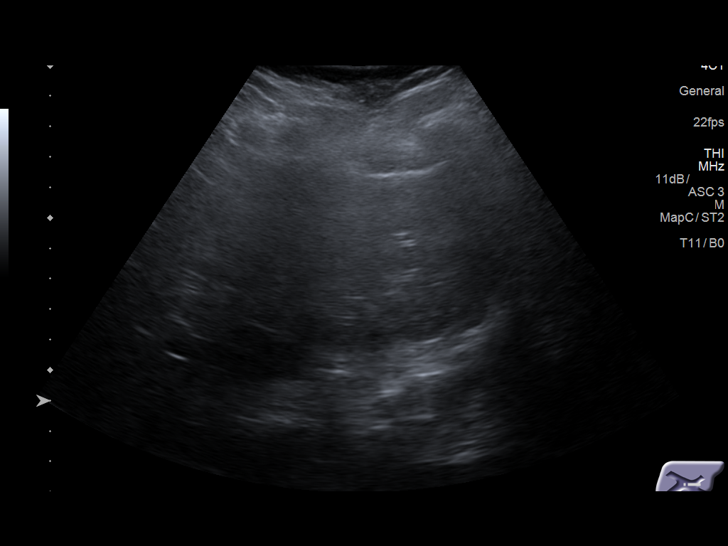
[im 17/67]
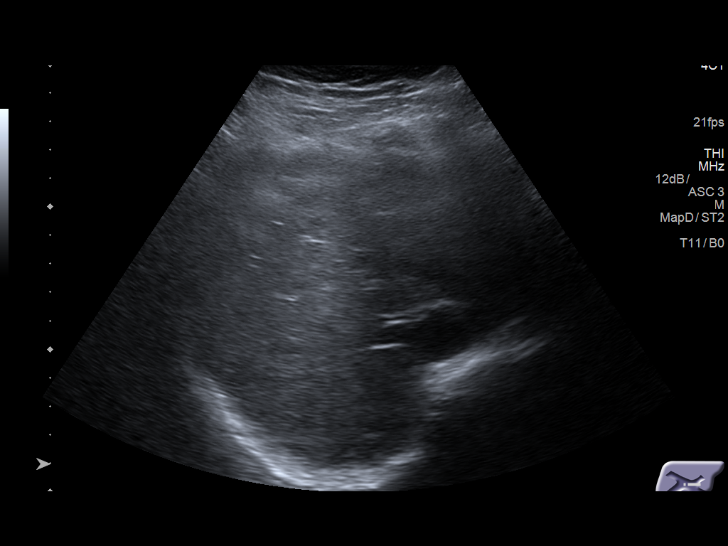
[im 23/67]
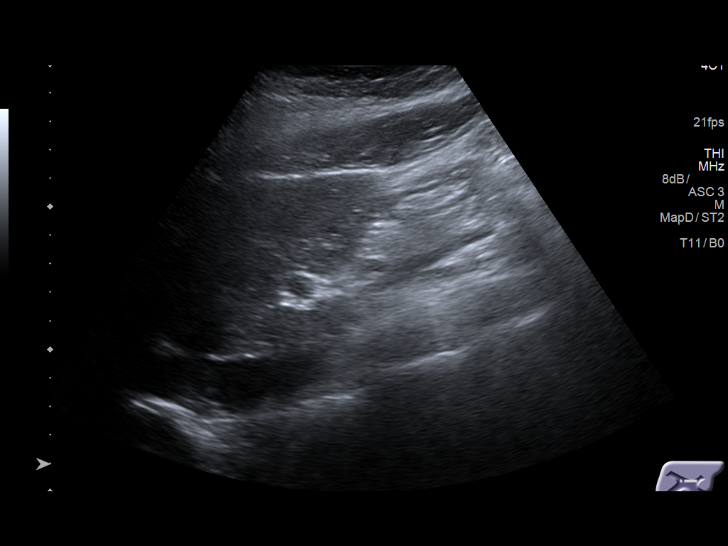
[im 25/67]
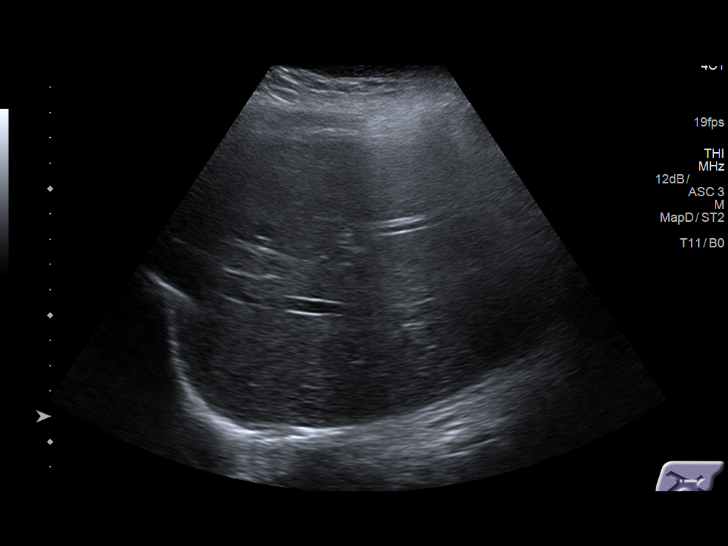
[im 31/67]
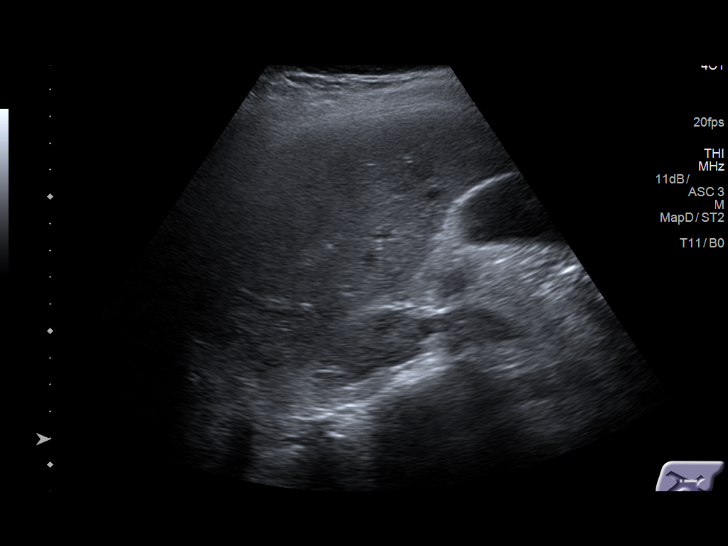
[im 36/67]
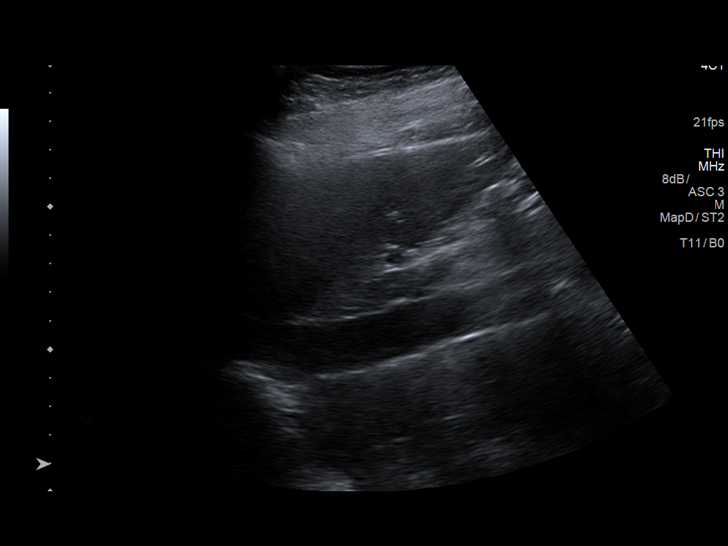
[im 42/67]
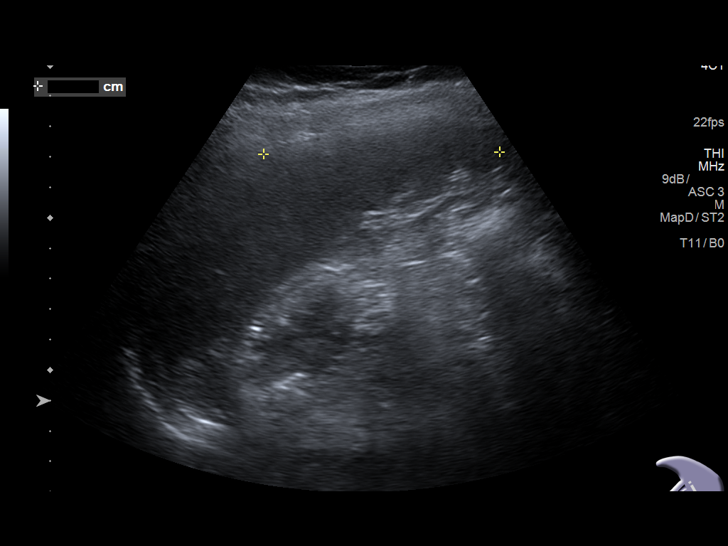
[im 45/67]
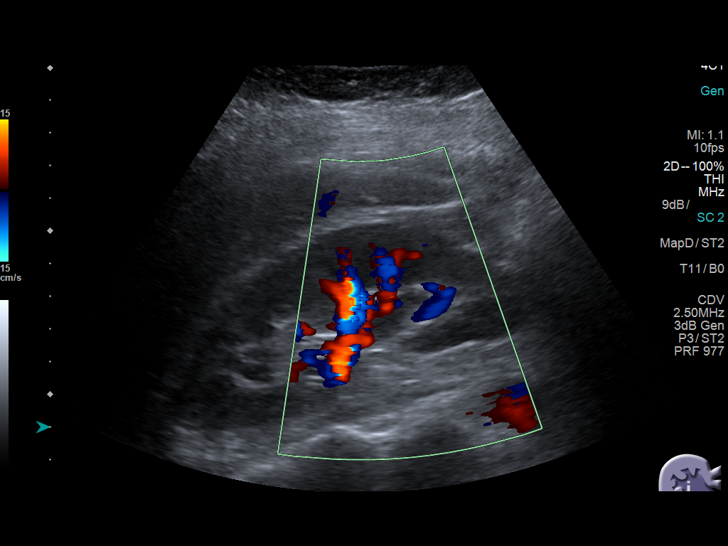
[im 50/67]
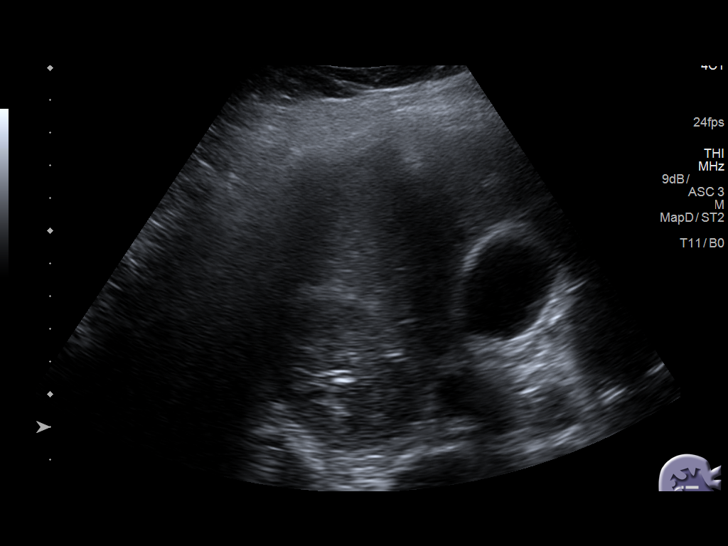
[im 56/67]
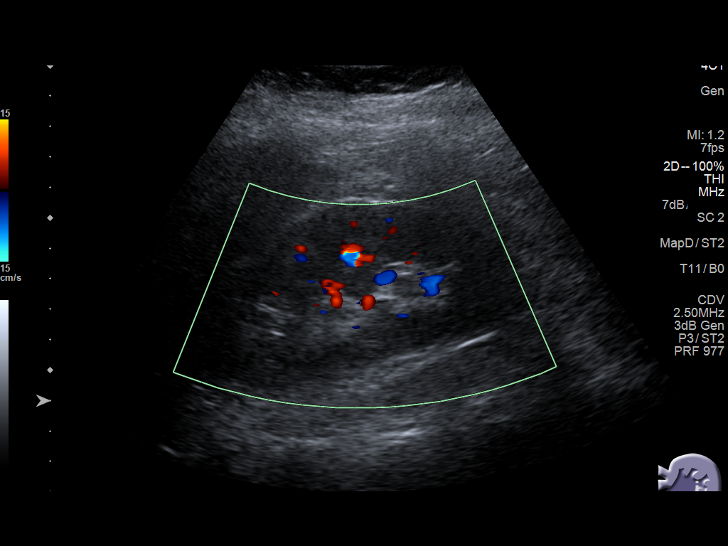
[im 61/67]
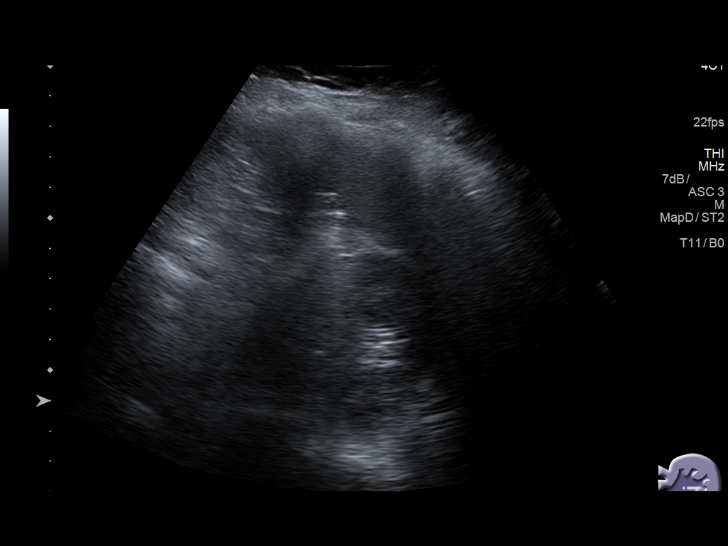
[im 67/67]
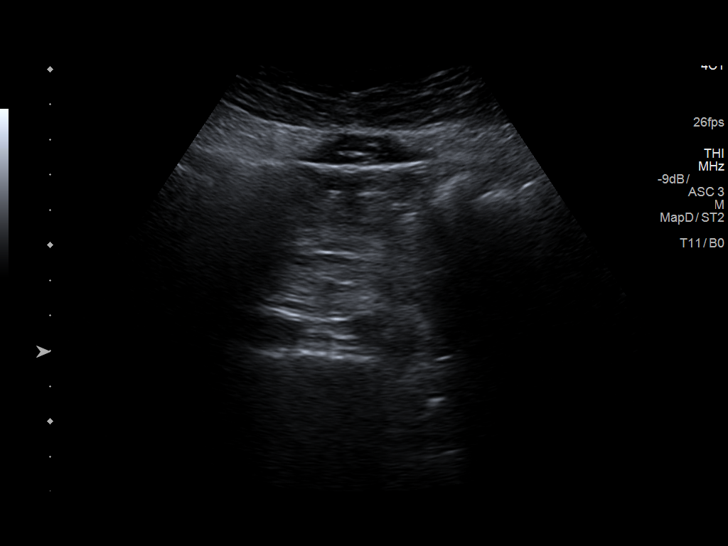

[14 of 25 positions shown; findings below may reference images not displayed]

FINDINGS: Gallbladder: No gallstones or wall thickening visualized. No
sonographic Murphy sign noted.

Common bile duct: Diameter: 3 mm

Liver: Heterogeneous lung attenuation without intrahepatic biliary
dilatation. Hepatopetal portal vein.

IVC: No abnormality visualized.

Pancreas: Visualized portion unremarkable.

Spleen: Size and appearance within normal limits.

Right Kidney: Length: 10.8 cm. Echogenicity within normal limits. No
mass or hydronephrosis visualized.

Left Kidney: Length: 11.3 cm. Echogenicity within normal limits. No
mass or hydronephrosis visualized.

Abdominal aorta: No aneurysm visualized.

Other findings: Small RIGHT pleural effusion.
IMPRESSION: Heterogeneous lung attenuation may represent hepatic steatosis.

No acute cholecystitis.  No obstructive uropathy.

Small RIGHT pleural effusion, recommend dedicated chest radiograph.
